# Patient Record
Sex: Male | Born: 1995 | Race: White | Hispanic: No | Marital: Single
Health system: Southern US, Community
[De-identification: ages and names within clinical notes are randomized; demographics above are authoritative.]

---

## 2018-08-24 ENCOUNTER — Other Ambulatory Visit: Payer: Self-pay | Admitting: Orthopedic Surgery

## 2018-08-24 ENCOUNTER — Other Ambulatory Visit (HOSPITAL_COMMUNITY): Payer: Self-pay | Admitting: Orthopedic Surgery

## 2018-08-24 DIAGNOSIS — M7989 Other specified soft tissue disorders: Principal | ICD-10-CM

## 2018-08-24 DIAGNOSIS — S85002A Unspecified injury of popliteal artery, left leg, initial encounter: Secondary | ICD-10-CM

## 2018-08-24 DIAGNOSIS — M79605 Pain in left leg: Secondary | ICD-10-CM

## 2018-08-24 DIAGNOSIS — M79604 Pain in right leg: Secondary | ICD-10-CM

## 2018-08-25 ENCOUNTER — Ambulatory Visit (HOSPITAL_COMMUNITY)
Admission: RE | Admit: 2018-08-25 | Discharge: 2018-08-25 | Disposition: A | Discharge status: Home / Self Care | Payer: Commercial Managed Care - PPO | Source: Ambulatory Visit | Attending: Orthopedic Surgery | Admitting: Orthopedic Surgery

## 2018-08-25 DIAGNOSIS — M79605 Pain in left leg: Secondary | ICD-10-CM | POA: Insufficient documentation

## 2018-08-25 DIAGNOSIS — M7989 Other specified soft tissue disorders: Secondary | ICD-10-CM | POA: Diagnosis present

## 2018-08-25 NOTE — Progress Notes (Signed)
Preliminary notes--Left lower extremity venous duplex exam completed. Negative for DVT.  Result called Dr. Thurston Hole.  Ruger Saxer H Kristianna Saperstein(RDMS RVT) 08/25/18 5:47 PM

## 2018-08-26 ENCOUNTER — Ambulatory Visit
Admission: RE | Admit: 2018-08-26 | Discharge: 2018-08-26 | Disposition: A | Discharge status: Home / Self Care | Payer: Commercial Managed Care - PPO | Source: Ambulatory Visit | Attending: Orthopedic Surgery | Admitting: Orthopedic Surgery

## 2018-08-26 DIAGNOSIS — S85002A Unspecified injury of popliteal artery, left leg, initial encounter: Secondary | ICD-10-CM

## 2018-08-26 MED ORDER — IOPAMIDOL (ISOVUE-370) INJECTION 76%
125.0000 mL | Freq: Once | INTRAVENOUS | Status: AC | PRN
  Administered 2018-08-26: 125 mL via INTRAVENOUS

## 2020-04-17 IMAGING — CT CT ANGIO AOBIFEM WO/W CM
1 of 8 series · 5 of 16 positions shown, 7 images · IV contrast (iopamidol)
Comparison: None.

CLINICAL DATA: 21-year-old with preoperative evaluation for left
leg injury. Evaluate for popliteal artery injury.

EXAM:
CT ANGIOGRAPHY OF ABDOMINAL AORTA WITH ILIOFEMORAL RUNOFF
TECHNIQUE: Multidetector CT imaging of the abdomen, pelvis and lower
extremities was performed using the standard protocol during bolus
administration of intravenous contrast. Multiplanar CT image
reconstructions and MIPs were obtained to evaluate the vascular
anatomy.
CONTRAST:  125mL [id number] IOPAMIDOL ([id number]) INJECTION 76%

[Series 5: cta whole body 2.00 bv36 s3 axial 2 mm · axial · 0.84mm/px · z∈[+623,+1663]mm · 5 of 781 slices shown, 7 images]
[im 131/781  soft-tissue]
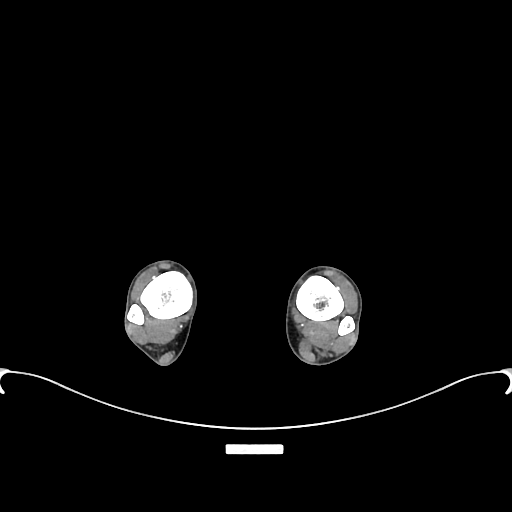
[im 131/781  bone]
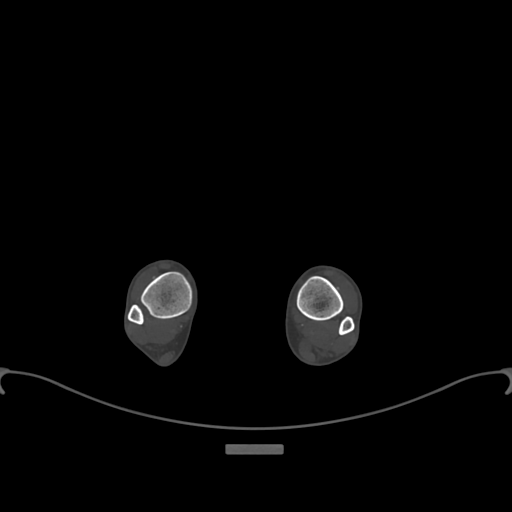
[im 261/781  soft-tissue]
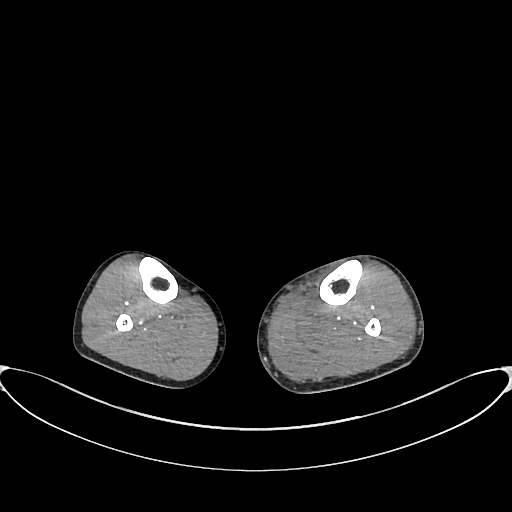
[im 391/781  soft-tissue]
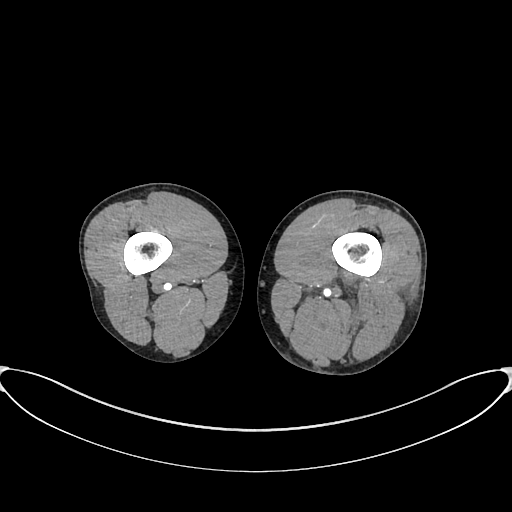
[im 521/781  soft-tissue]
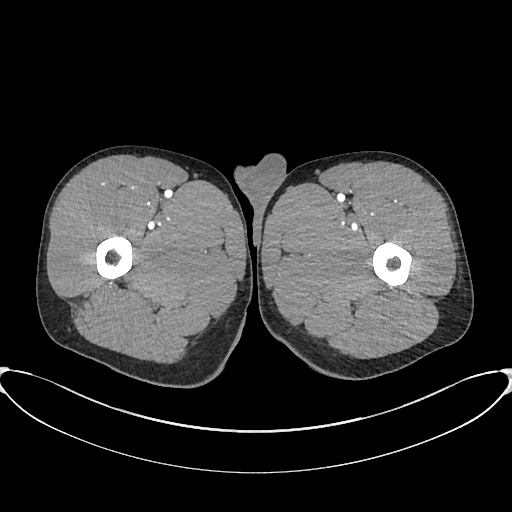
[im 651/781  soft-tissue]
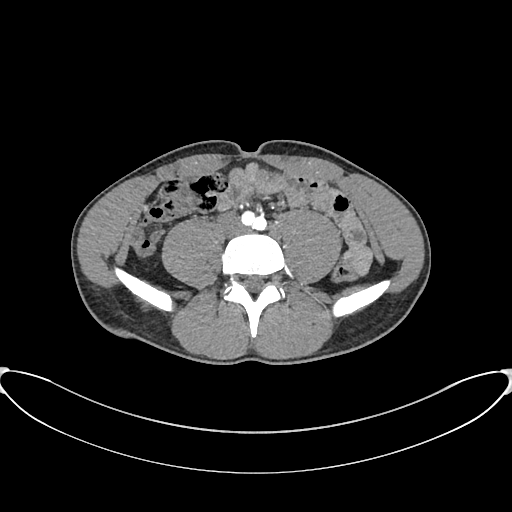
[im 651/781  bone]
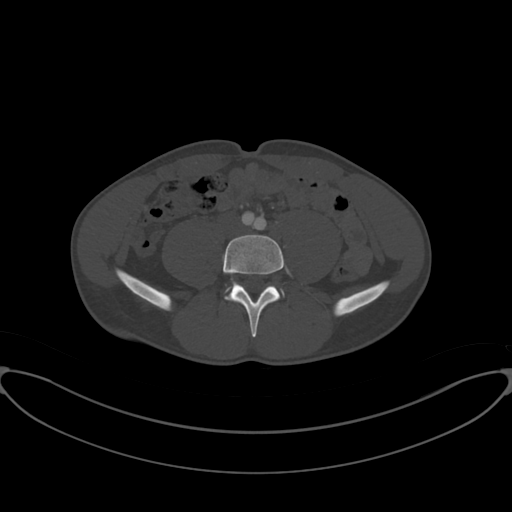

[5 of 16 positions shown; findings below may reference images not displayed]

FINDINGS: VASCULAR

Aorta: Normal caliber aorta without aneurysm, dissection, vasculitis
or significant stenosis.

Celiac: Patent without evidence of aneurysm, dissection, vasculitis
or significant stenosis.

SMA: Patent without evidence of aneurysm, dissection, vasculitis or
significant stenosis.

Renals: Both renal arteries are patent without evidence of aneurysm,
dissection, vasculitis, fibromuscular dysplasia or significant
stenosis.

IMA: Patent without evidence of aneurysm, dissection, vasculitis or
significant stenosis.

RIGHT Lower Extremity

Inflow: Common, internal and external iliac arteries are patent
without evidence of aneurysm, dissection, vasculitis or significant
stenosis.

Outflow: Common, superficial and profunda femoral arteries and the
popliteal artery are patent without evidence of aneurysm,
dissection, vasculitis or significant stenosis.

Runoff: Patent three vessel runoff to the ankle.

LEFT Lower Extremity

Inflow: Common, internal and external iliac arteries are patent
without evidence of aneurysm, dissection, vasculitis or significant
stenosis.

Outflow: Common, superficial and profunda femoral arteries and the
popliteal artery are patent without evidence of aneurysm,
dissection, vasculitis or significant stenosis.

Runoff: Patent three vessel runoff to the ankle.

Veins: No obvious venous abnormality within the limitations of this
arterial phase study.

Review of the MIP images confirms the above findings.

NON-VASCULAR

Lower chest: No acute abnormality.

Hepatobiliary: No focal liver abnormality is seen. No gallstones,
gallbladder wall thickening, or biliary dilatation.

Pancreas: Unremarkable. No pancreatic ductal dilatation or
surrounding inflammatory changes.

Spleen: Normal in size without focal abnormality.

Adrenals/Urinary Tract: Calcifications involving the right adrenal
gland and least 1 punctate calcification involving the left adrenal
gland. Findings could be related to prior hemorrhage or old
inflammation. Normal appearance of both kidneys without
hydronephrosis. Urinary bladder is unremarkable.

Stomach/Bowel: Stomach is within normal limits. Appendix appears
normal. However, there appears to be a small calcification or
appendicolith within the appendix. No evidence of bowel wall
thickening, distention, or inflammatory changes.

Lymphatic: No lymph node enlargement in the abdomen or pelvis.

Reproductive: Prostate is unremarkable.

Other: No free fluid.  Negative for free air.

Musculoskeletal: Small left knee suprapatellar joint effusion. The
ACL is enlarged and edematous and compatible with recent injury.
Subcutaneous edema in the left calf. There is fluid or blood
products in the soft tissues just medial and above the knee joint.
Soft tissue swelling in the distal left femur.
IMPRESSION: VASCULAR

No evidence for an arterial or vascular injury. Normal appearance of
the left popliteal artery.

Normal appearance of the arteries in the abdomen, pelvis and lower
extremities.

NON-VASCULAR

Soft tissue injury in the left knee.

Adrenal gland calcifications, right side greater than left. Findings
could be related to old hemorrhage or inflammation.
